# Patient Record
Sex: Male | Born: 1985 | Race: Black or African American | Hispanic: No | Marital: Married | Smoking: Never smoker
Health system: Southern US, Community
[De-identification: ages and names within clinical notes are randomized; demographics above are authoritative.]

## PROBLEM LIST (undated history)

## (undated) HISTORY — PX: CHOLECYSTECTOMY: SHX55

---

## 2012-02-03 ENCOUNTER — Emergency Department: Payer: Self-pay | Admitting: Internal Medicine

## 2012-02-07 ENCOUNTER — Emergency Department: Payer: Self-pay | Admitting: Emergency Medicine

## 2012-08-16 ENCOUNTER — Observation Stay: Payer: Self-pay | Admitting: Surgery

## 2012-08-16 LAB — BASIC METABOLIC PANEL
Calcium, Total: 8.7 mg/dL (ref 8.5–10.1)
Chloride: 107 mmol/L (ref 98–107)
Co2: 27 mmol/L (ref 21–32)
Creatinine: 0.91 mg/dL (ref 0.60–1.30)
EGFR (African American): >60
EGFR (Non-African Amer.): >60
Glucose: 95 mg/dL (ref 65–99)
Osmolality: 274 (ref 275–301)

## 2012-08-16 LAB — CBC
HCT: 41.8 % (ref 40.0–52.0)
HGB: 13.6 g/dL (ref 13.0–18.0)
RBC: 5.29 10*6/uL (ref 4.40–5.90)
RDW: 15.8 % — ABNORMAL HIGH (ref 11.5–14.5)

## 2012-08-16 LAB — HEPATIC FUNCTION PANEL A (ARMC)
Alkaline Phosphatase: 44 U/L — ABNORMAL LOW (ref 50–136)
Bilirubin, Direct: 0.2 mg/dL (ref 0.00–0.20)
Bilirubin,Total: 1 mg/dL (ref 0.2–1.0)
SGPT (ALT): 31 U/L (ref 12–78)

## 2012-08-16 LAB — LIPASE, BLOOD: Lipase: 85 U/L (ref 73–393)

## 2012-08-17 LAB — COMPREHENSIVE METABOLIC PANEL
Albumin: 3.7 g/dL (ref 3.4–5.0)
Anion Gap: 3 — ABNORMAL LOW (ref 7–16)
Bilirubin,Total: 0.9 mg/dL (ref 0.2–1.0)
Calcium, Total: 8.6 mg/dL (ref 8.5–10.1)
Chloride: 103 mmol/L (ref 98–107)
Creatinine: 0.92 mg/dL (ref 0.60–1.30)
Potassium: 3.8 mmol/L (ref 3.5–5.1)
SGOT(AST): 42 U/L — ABNORMAL HIGH (ref 15–37)
SGPT (ALT): 34 U/L (ref 12–78)
Total Protein: 7.4 g/dL (ref 6.4–8.2)

## 2012-08-17 LAB — CBC WITH DIFFERENTIAL/PLATELET
Basophil #: 0 10*3/uL (ref 0.0–0.1)
Basophil %: 0.1 %
Eosinophil #: 0 10*3/uL (ref 0.0–0.7)
Eosinophil %: 0 %
HCT: 39 % — ABNORMAL LOW (ref 40.0–52.0)
Lymphocyte #: 1 10*3/uL (ref 1.0–3.6)
MCH: 25.9 pg — ABNORMAL LOW (ref 26.0–34.0)
MCHC: 32.8 g/dL (ref 32.0–36.0)
MCV: 79 fL — ABNORMAL LOW (ref 80–100)
Monocyte #: 1.4 x10 3/mm — ABNORMAL HIGH (ref 0.2–1.0)
Monocyte %: 15.7 %
Neutrophil #: 6.6 10*3/uL — ABNORMAL HIGH (ref 1.4–6.5)
Neutrophil %: 73.2 %
RBC: 4.93 10*6/uL (ref 4.40–5.90)
RDW: 15.7 % — ABNORMAL HIGH (ref 11.5–14.5)
WBC: 9.1 10*3/uL (ref 3.8–10.6)

## 2012-08-18 LAB — CBC WITH DIFFERENTIAL/PLATELET
Basophil #: 0 10*3/uL (ref 0.0–0.1)
Eosinophil %: 0.5 %
HCT: 34.9 % — ABNORMAL LOW (ref 40.0–52.0)
Lymphocyte #: 1.7 10*3/uL (ref 1.0–3.6)
Lymphocyte %: 21.5 %
MCH: 26.9 pg (ref 26.0–34.0)
Monocyte #: 1.3 x10 3/mm — ABNORMAL HIGH (ref 0.2–1.0)
Monocyte %: 16.6 %
Neutrophil #: 4.9 10*3/uL (ref 1.4–6.5)
Neutrophil %: 61.1 %
Platelet: 204 10*3/uL (ref 150–440)

## 2012-08-18 LAB — COMPREHENSIVE METABOLIC PANEL
Albumin: 3.1 g/dL — ABNORMAL LOW (ref 3.4–5.0)
Anion Gap: 3 — ABNORMAL LOW (ref 7–16)
BUN: 6 mg/dL — ABNORMAL LOW (ref 7–18)
Bilirubin,Total: 0.6 mg/dL (ref 0.2–1.0)
Calcium, Total: 8.5 mg/dL (ref 8.5–10.1)
EGFR (Non-African Amer.): >60
Osmolality: 271 (ref 275–301)
Potassium: 3.8 mmol/L (ref 3.5–5.1)
SGPT (ALT): 30 U/L (ref 12–78)
Total Protein: 6.6 g/dL (ref 6.4–8.2)

## 2013-07-25 IMAGING — CR DG LUMBAR SPINE 2-3V
1 series · 3 of 3 positions shown · non-contrast
Comparison: none

REASON FOR EXAM: pain following trauma
COMMENTS:

[Series 1: t lumbar spine ap · 0.14mm/px · 3 of 3 slices shown]
[im 1/3]
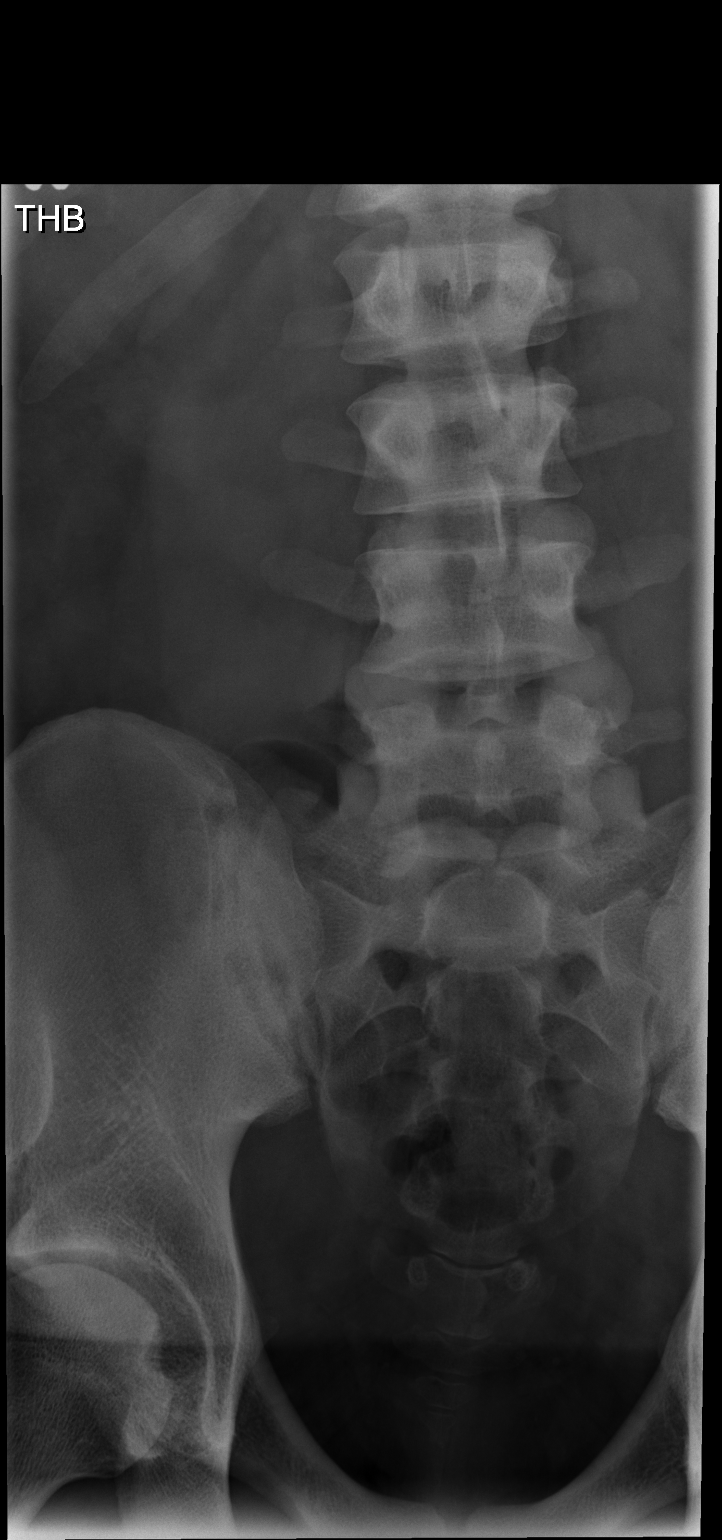
[im 2/3]
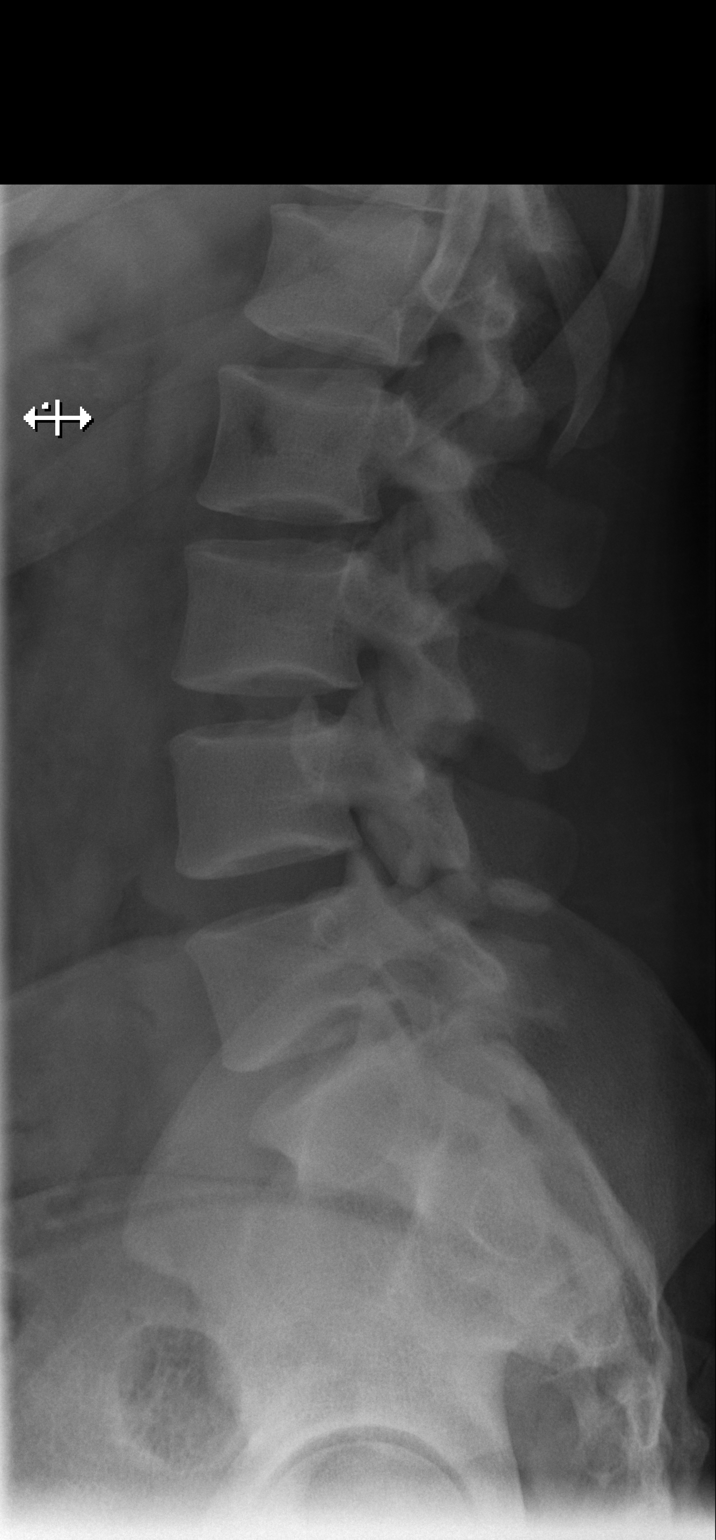
[im 3/3]
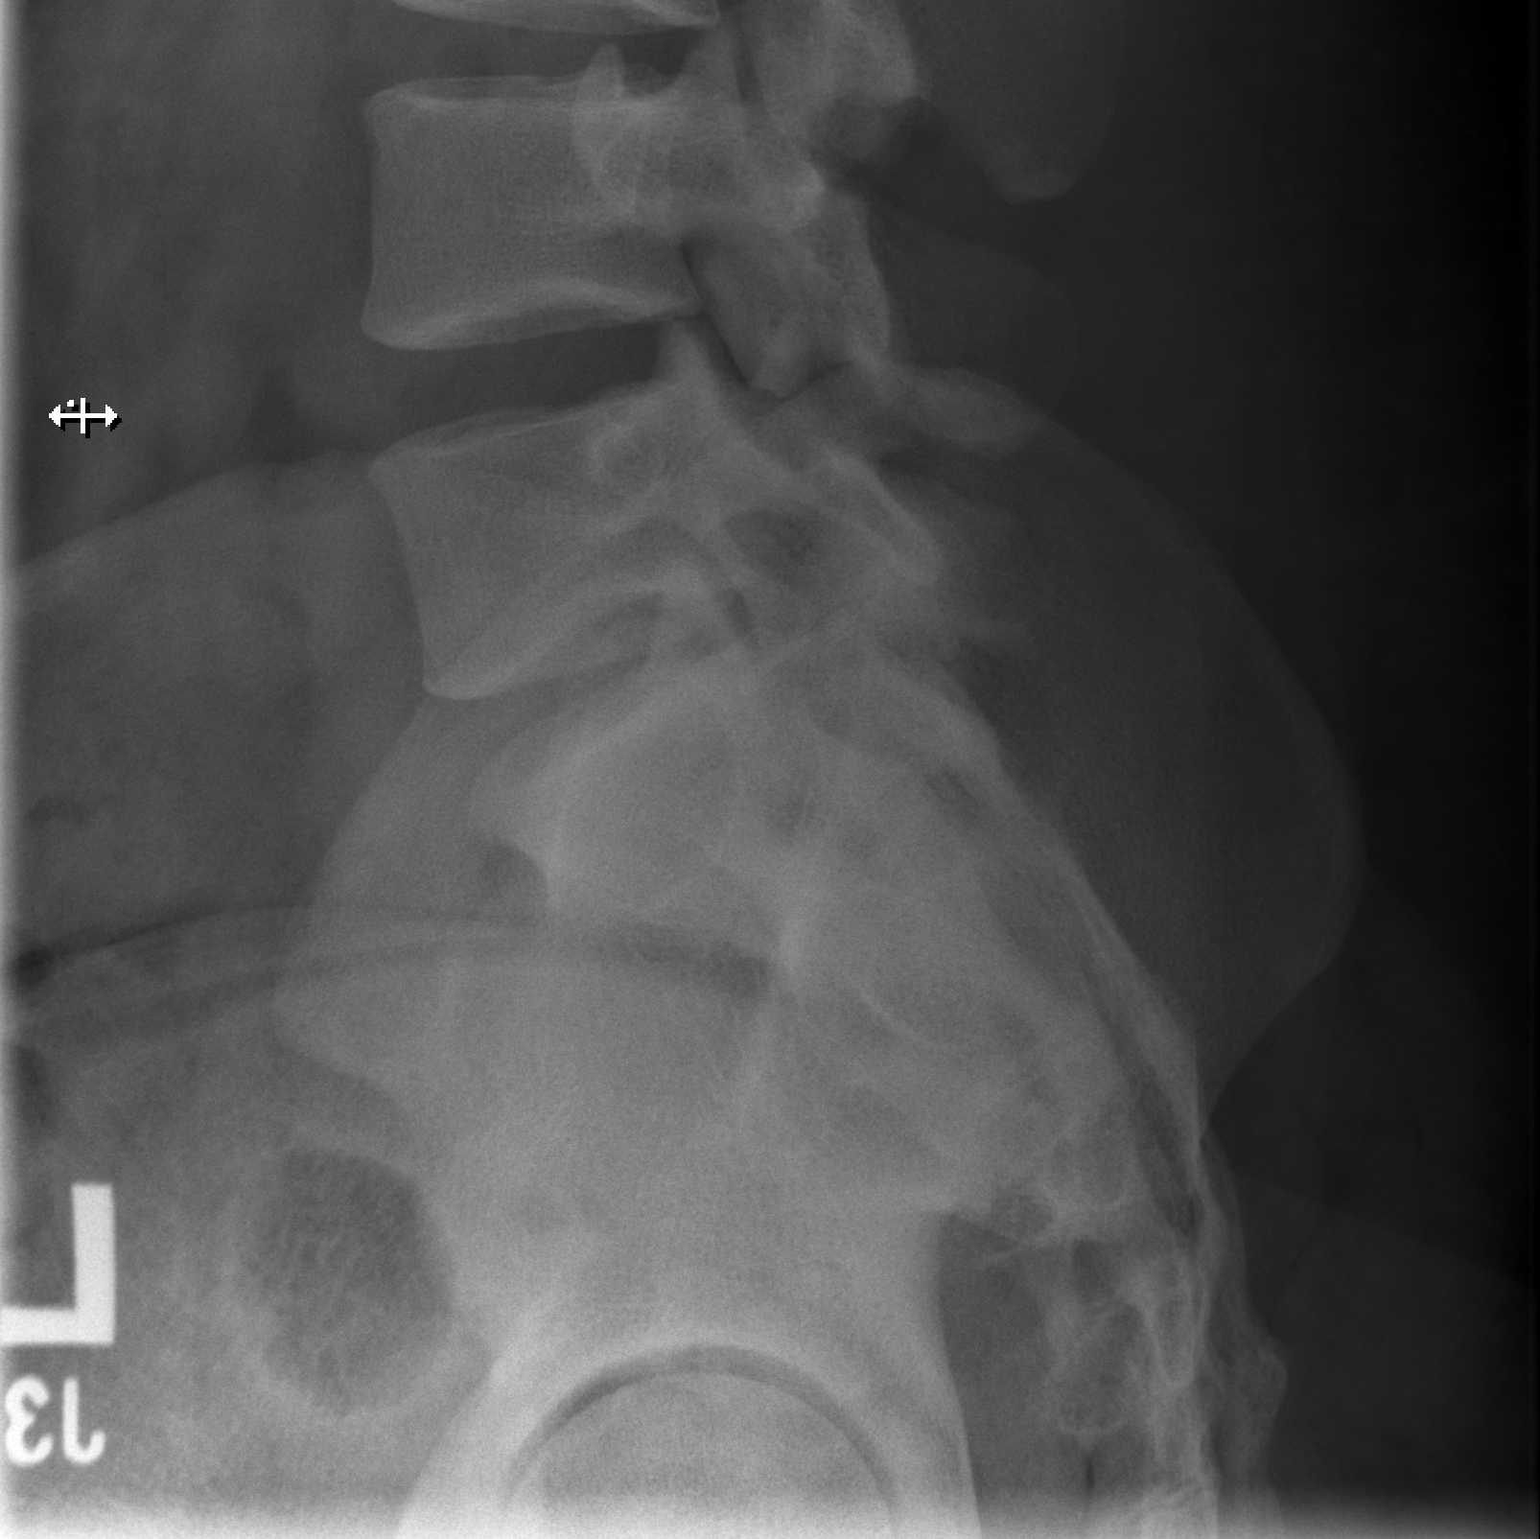

[3 of 3 positions shown; findings below may reference images not displayed]

PROCEDURE:     DXR - DXR LUMBAR SPINE AP AND LATERAL  - February 07, 2012  [DATE]

RESULT:     The lumbar vertebral bodies are preserved in height. The
intervertebral disc space heights are well-maintained. There is no evidence
of a pars defect or spondylolisthesis. The pedicles appear intact as do the
transverse processes. There is a spina bifida occulta at S1.
IMPRESSION: I do not see objective evidence of an acute fracture of the
lumbar spine. Followup cross-sectional imaging is available if patient's
symptoms warrant further evaluation.

[REDACTED]

## 2013-08-25 ENCOUNTER — Emergency Department: Payer: Self-pay | Admitting: Emergency Medicine

## 2013-08-25 LAB — CBC WITH DIFFERENTIAL/PLATELET
BASOS PCT: 0.5 %
Basophil #: 0 10*3/uL (ref 0.0–0.1)
EOS PCT: 0.3 %
Eosinophil #: 0 10*3/uL (ref 0.0–0.7)
HCT: 45.2 % (ref 40.0–52.0)
HGB: 14.5 g/dL (ref 13.0–18.0)
LYMPHS ABS: 1.3 10*3/uL (ref 1.0–3.6)
Lymphocyte %: 19.7 %
MCH: 25 pg — ABNORMAL LOW (ref 26.0–34.0)
MCHC: 32.1 g/dL (ref 32.0–36.0)
MCV: 78 fL — AB (ref 80–100)
MONOS PCT: 12.1 %
Monocyte #: 0.8 x10 3/mm (ref 0.2–1.0)
NEUTROS PCT: 67.4 %
Neutrophil #: 4.4 10*3/uL (ref 1.4–6.5)
Platelet: 186 10*3/uL (ref 150–440)
RBC: 5.8 10*6/uL (ref 4.40–5.90)
RDW: 15 % — ABNORMAL HIGH (ref 11.5–14.5)
WBC: 6.6 10*3/uL (ref 3.8–10.6)

## 2013-08-25 LAB — MONONUCLEOSIS SCREEN: MONO TEST: NEGATIVE

## 2013-08-27 LAB — BETA STREP CULTURE(ARMC)

## 2014-02-01 IMAGING — US ABDOMEN ULTRASOUND LIMITED
1 series · 14 of 25 positions shown · non-contrast
Comparison: none

REASON FOR EXAM: abd pain
COMMENTS:

PROCEDURE:     US  - US ABDOMEN LIMITED SURVEY  - August 16, 2012  [DATE]
RESULT:

[Series 1: abdomen ultrasound limited · 0.36mm/px · 14 of 49 slices shown]
[im 1/49]
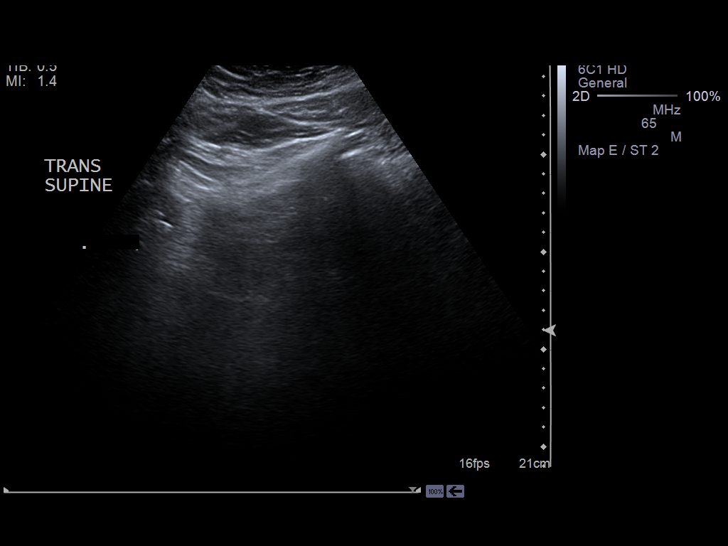
[im 5/49]
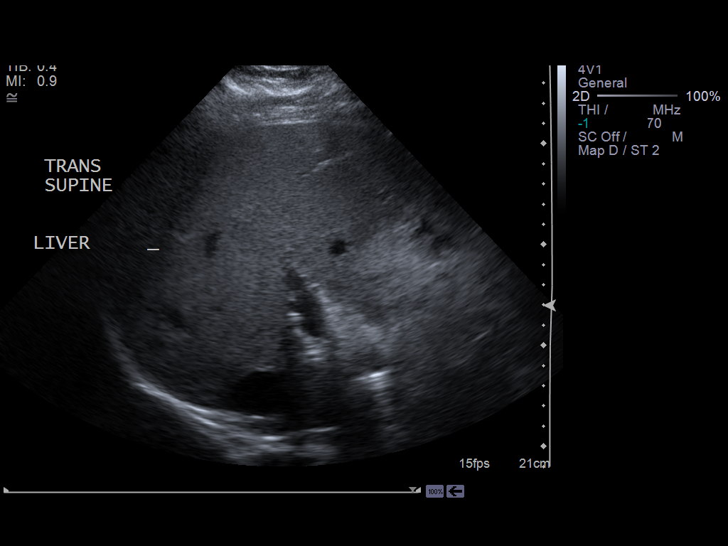
[im 9/49]
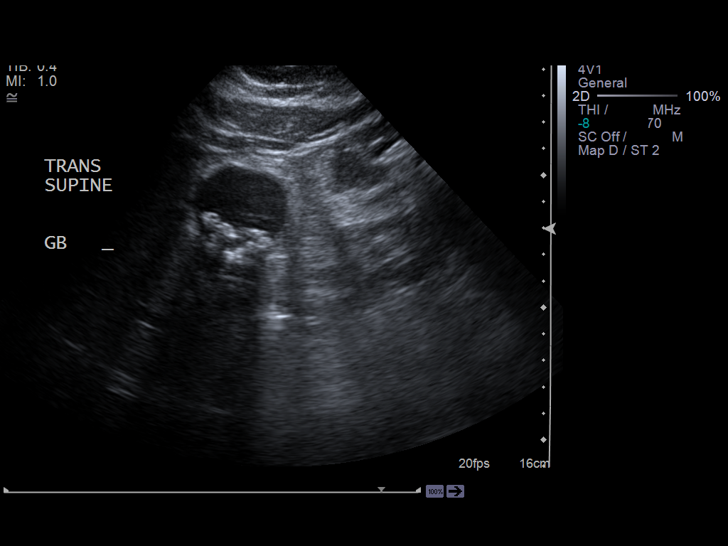
[im 13/49]
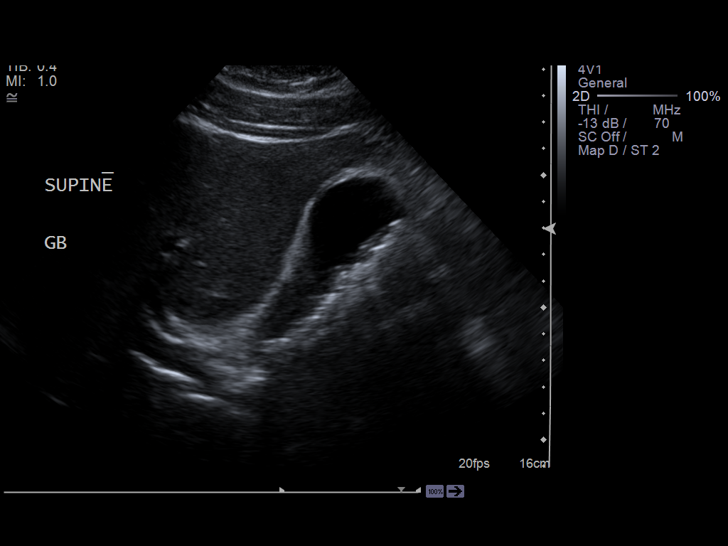
[im 17/49]
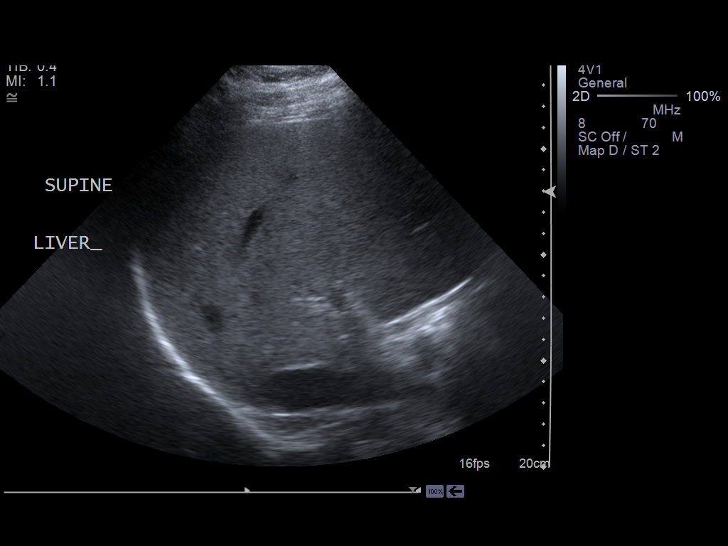
[im 19/49]
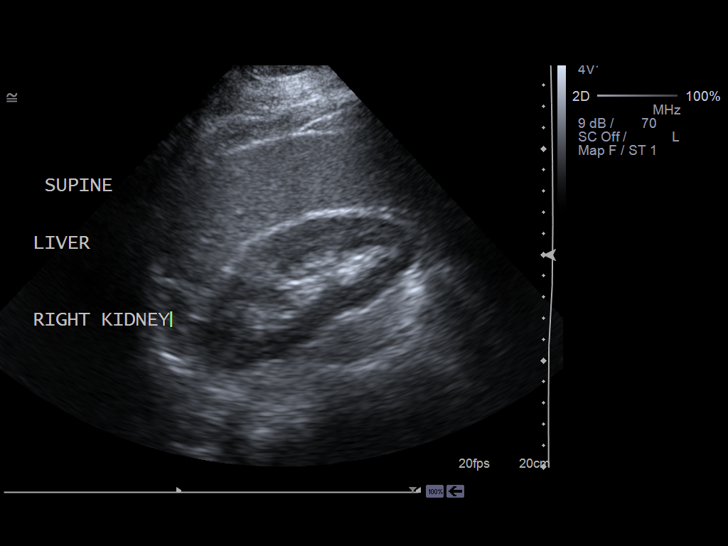
[im 23/49]
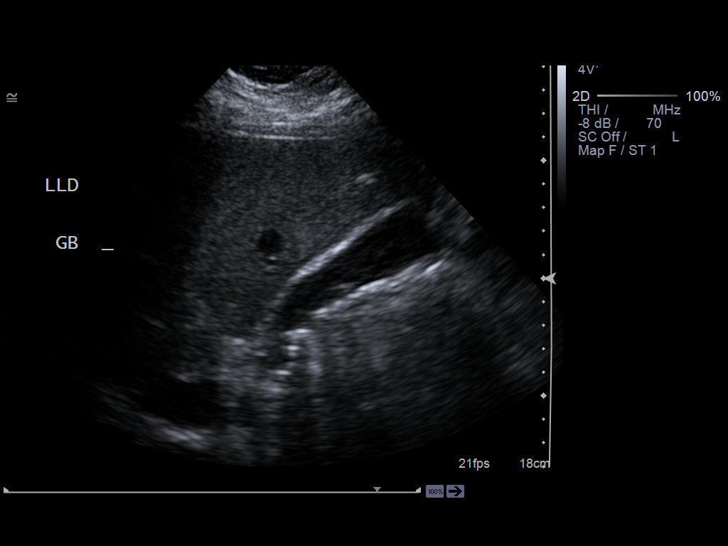
[im 27/49]
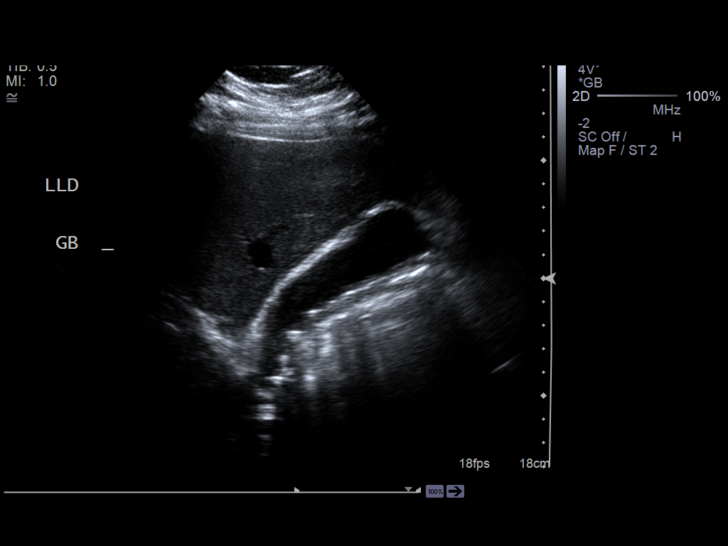
[im 31/49]
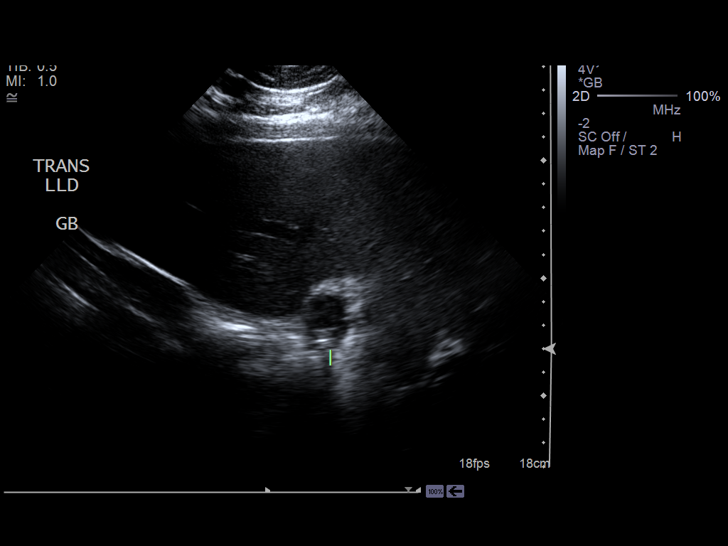
[im 33/49]
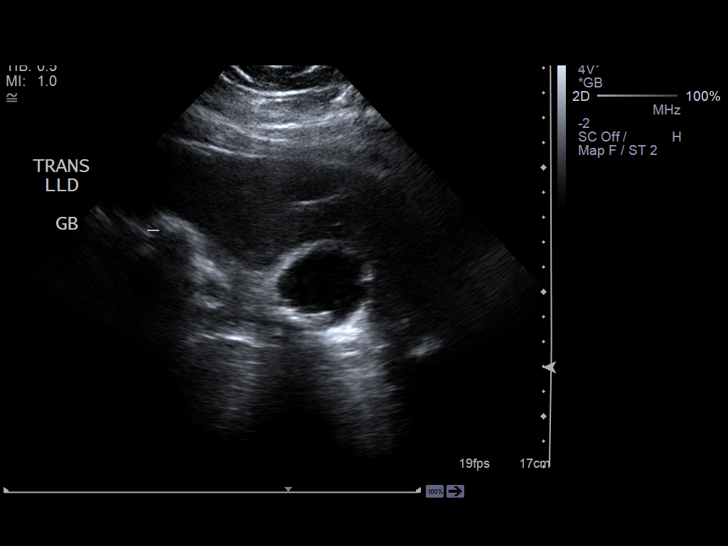
[im 37/49]
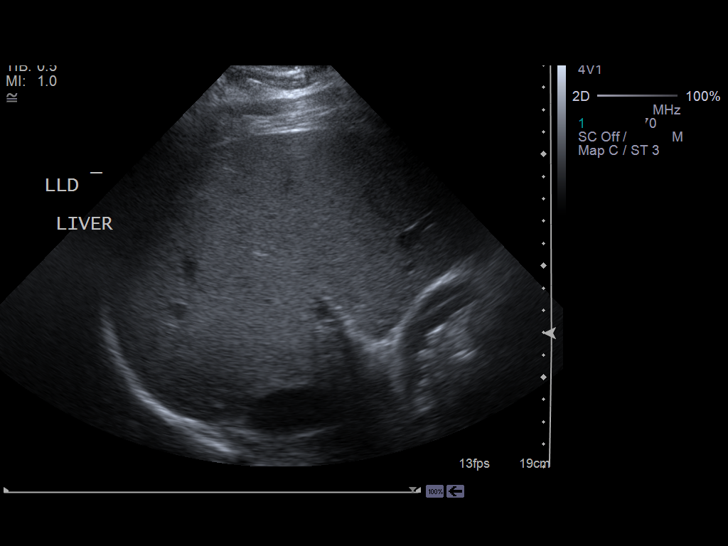
[im 41/49]
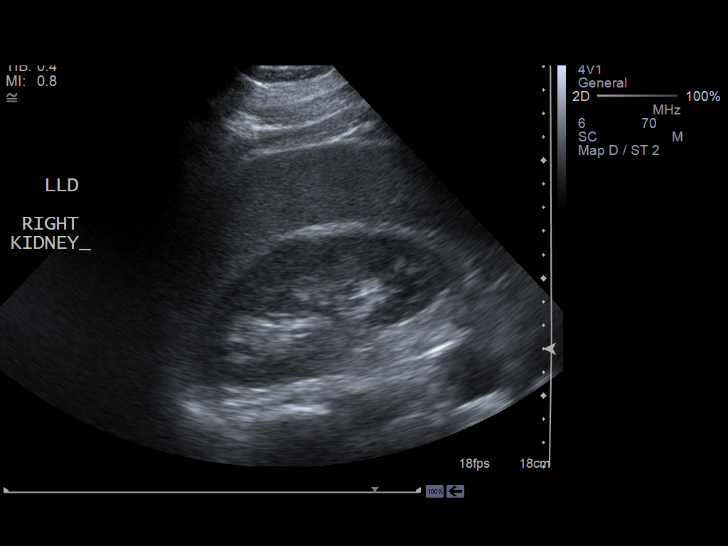
[im 45/49]
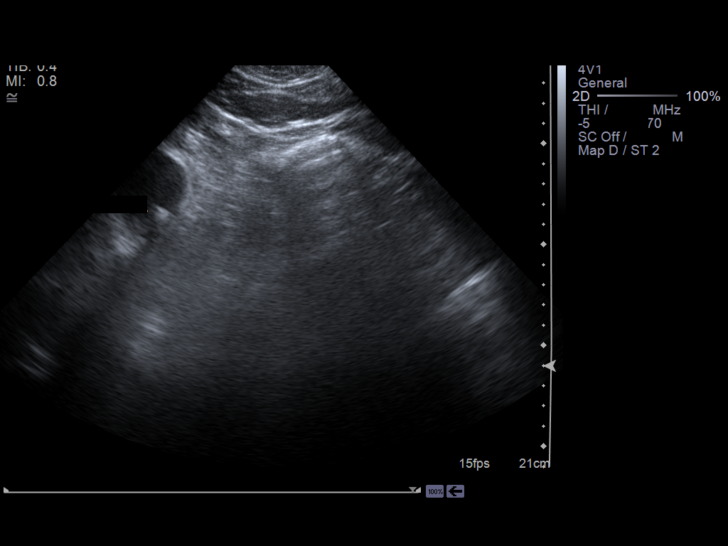
[im 49/49]
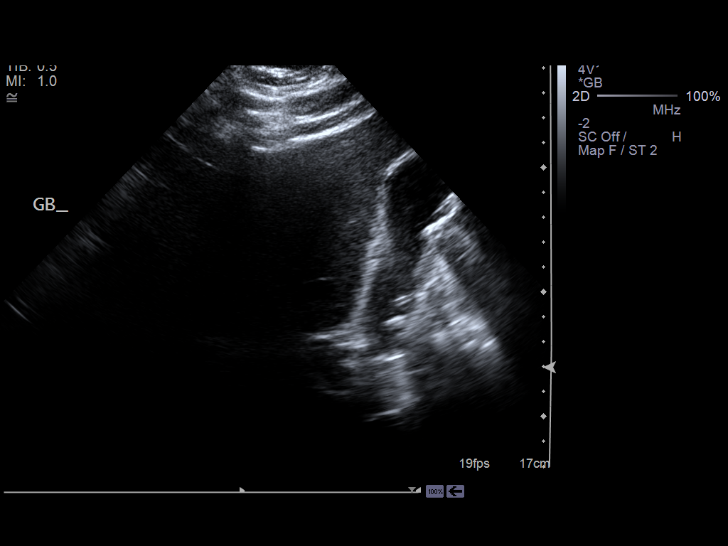

[14 of 25 positions shown; findings below may reference images not displayed]

FINDINGS: The liver demonstrates a homogeneous echotexture. Evaluation of
the gallbladder demonstrates multiple gallstones within the gallbladder with
non-mobile gallstones in the neck of the gallbladder. There is gallbladder
wall thickening at 4 mm. A positive sonographic Murphy's sign is identified.
There is no evidence of intrahepatic or extrahepatic biliary ductal
dilatation. The visualized portions of the pancreas are unremarkable. The
right kidney is unremarkable.
IMPRESSION: 1.  Multiple calculi within the gallbladder with stones appearing lodged in
the neck. There is gallbladder wall thickening and a sonographic Murphy's
sign. These findings are consistent with cholecystitis.
2.  Dr. Vianney of the Emergency Department was informed of these findings via
a preliminary faxed report on 08/16/2012 at [DATE], ET.

## 2014-09-04 NOTE — Discharge Summary (Signed)
PATIENT NAME:  Jose BignessHINTON, Uziah MR#:  [id number] DATE OF BIRTH:  [birth year]  DATE OF ADMISSION:  08/16/2012 DATE OF DISCHARGE:  08/18/2012  DIAGNOSES:  1.  Morbid obesity.  2.  Cholelithiasis.  3.  Acute cholecystitis.  4.  Depression.   PROCEDURE: Laparoscopic cholecystectomy.   HISTORY OF PRESENT ILLNESS AND HOSPITAL COURSE: Patient was admitted to the hospital by Dr. Michela PitcherEly with the diagnosis of acute cholecystitis with cholelithiasis. He was taken to the operating room where laparoscopic cholecystectomy was performed. He had acute cholecystitis and empyema of the gallbladder. He had an uncomplicated postoperative recovery. His drain was removed, on postoperative day 2. No bile was noted. His fevers abated and he was discharged in stable condition on oral Augmentin and Percocet for pain. He is  to follow up in my office in 10 days.    ____________________________ Adah Salvageichard E. Excell Seltzerooper, MD rec:cc D: 08/18/2012 09:29:41 ET T: 08/18/2012 17:43:10 ET JOB#: [id number]  cc: Adah Salvageichard E. Excell Seltzerooper, MD, <Dictator> Lattie HawICHARD E Kaius Daino MD ELECTRONICALLY SIGNED 08/18/2012 18:51

## 2014-09-04 NOTE — Op Note (Signed)
PATIENT NAME:  Jose Lloyd, Jose Lloyd MR#:  [id number] DATE OF BIRTH:  1985/07/19  DATE OF PROCEDURE:  08/16/2012  PREOPERATIVE DIAGNOSIS: Acute cholecystitis.   POSTOPERATIVE DIAGNOSIS: Acute cholecystitis with empyema of the gallbladder.   PROCEDURE PERFORMED:  Laparoscopic cholecystectomy.   SURGEON: Richard E. Excell Seltzerooper, M.D.   ASSISTANT:  Simone CuriaKatie Shuler, PA-S  ANESTHESIA: General with endotracheal tube.   INDICATIONS: This is a patient with right upper quadrant pain and a workup showing gallstones and likely cholecystitis. Preoperatively, we discussed the rationale for surgery, the options of observation, the risk of bleeding, infection recurrence of symptoms, failure to resolve the symptoms, open procedure, bile duct damage, bile duct leak and retained common bile duct stone any of which could require further surgery and/or ERCP, stent and papillotomy. This was all reviewed for him in the preop holding area a second time. He understood and agreed to proceed.   FINDINGS: Acute cholecystitis with empyema, small gallstones, multiple gallstones impacted in the infundibulum of the gallbladder.   DESCRIPTION OF PROCEDURE: The patient was induced to general anesthesia. He was given IV antibiotics. VTE prophylaxis was in place. He was prepped and draped in a sterile fashion. Marcaine was infiltrated in the skin and subcutaneous tissues around the periumbilical area.  Incision was made. Veress needle was placed. Pneumoperitoneum was obtained. A 5 mm trocar port was placed. The abdominal cavity was explored and under direct vision a 10 mm epigastric port and 2 lateral 5 mm ports were placed. The gallbladder was placed on tension. It was found to be very edematous.  The peritoneum over the infundibulum was incised bluntly.  Multiple small cystic lymphatics were engorged; therefore, they were doubly clipped and divided. This allowed for good visualization of the cystic duct as it entered the infundibulum of the  gallbladder. Here it was doubly clipped and divided and then the cystic artery was doubly clipped and divided and the gallbladder was taken from the gallbladder fossa with electrocautery. It was found to be greatly edematous and almost necrotic. It was then passed out through an enlarged epigastric port site with the aid of an Endo Catch bag. The area was checked for hemostasis and found to be adequate. There was no sign of bile leak or bleeding or bowel injury.   Into the foramen of Winslow was placed a 10 mm JP drain brought out through a lateral port site. It was sewn in with 3-0 nylon. Again the area was checked for hemostasis. There was no sign of bile leak or bleeding. The camera was placed in the epigastric site to view back to the periumbilical site. There was no sign of bowel injury. Therefore, pneumoperitoneum was released. All ports were removed. Fascial edges at the epigastric site where approximated with figure-of-eight 0 Vicryls, made difficult in this obese patient, with an enlarged port site. Steri-Strips, Mastisol and sterile dressings were placed after placing 4-0 subcuticular Monocryl at all skin edges. He was taken to the recovery room in stable condition to be admitted for continued care.  ____________________________ Adah Salvageichard E. Excell Seltzerooper, MD rec:sb D: 08/16/2012 13:28:29 ET T: 08/16/2012 13:52:12 ET JOB#: [id number]  cc: Adah Salvageichard E. Excell Seltzerooper, MD, <Dictator> Lattie HawICHARD E COOPER MD ELECTRONICALLY SIGNED 08/16/2012 14:06

## 2014-09-04 NOTE — H&P (Signed)
PATIENT NAME:  Jose Lloyd, Jose Lloyd MR#:  [id number] DATE OF BIRTH:  03-Feb-1986  DATE OF ADMISSION:  08/16/2012  PRIMARY CARE PHYSICIAN: None.   ADMITTING PHYSICIAN: Quentin Orealph L. Ely III, MD   CHIEF COMPLAINT: Abdominal pain, nausea.   BRIEF HISTORY: The patient is a 29 year old gentleman with 3- to 4-day history of midepigastric, subxiphoid and right upper quadrant abdominal pain. He has had previous similar symptoms approximately 3 or 4 times in the past, all of which had lasted 8 to 12 hours, occasionally as long as 24 hours. No episodes have lasted as long as the current episode nor been as symptomatic. He presented to the Emergency Room with marked midepigastric and subxiphoid pain. Workup revealed multiple gallstones, possible impacted cystic duct stone. Normal white blood cell count, normal metabolic panel. Liver function studies are not available as yet.   The patient denies a history of vomiting, although he has been significantly nauseated. He has not eaten anything since yesterday afternoon. He has had mild diarrhea. He has had no fever or chills. He denies a history of hepatitis, yellow jaundice, pancreatitis, peptic ulcer disease, previous diagnosis of gallbladder disease or diverticulitis. He has had no previous abdominal surgery. He denies any other medical problems.   MEDICATIONS: His only current medication is cyclobenzaprine 10 mg t.i.d.   ALLERGIES: He has no medical allergies.   SOCIAL HISTORY: He works as a Architectural technologistteacher's assistant.   REVIEW OF SYSTEMS: A 10-point review of systems is unremarkable.   FAMILY HISTORY: Noncontributory.  PHYSICAL EXAMINATION:  GENERAL: He is an alert, pleasant gentleman in mild distress from pain.  VITALS: Blood pressure 158/84, heart rate 72 and regular. He is afebrile.  HEENT: Unremarkable. He has no scleral icterus. No pupillary abnormalities. No facial deformities.  NECK: Supple, nontender, with no adenopathy and a midline trachea.  CHEST: Clear with  no adventitious sounds. He has normal pulmonary excursion.  CARDIAC: No murmurs or gallops to my ear, and he seems to be in normal sinus rhythm.  ABDOMEN: Soft, with some mild midepigastric tenderness. He does have some mild guarding, without rebound. No hernias are noted. He has no surgical scars. He has active bowel sounds.  EXTREMITIES: There are no deformities. Full range of motion and good distal pulses.  PSYCHIATRIC: Normal orientation, normal affect.   IMPRESSION AND PLAN: This gentleman likely has biliary colic, possibly acute cholecystitis. I have not been able to review the ultrasound as yet. In discussing his symptoms, they appear to be persistent, improved with pain medication but not resolved. I have recommended that he consider surgical intervention. I have outlined surgical risks and benefits to him in detail. He is going to discuss it with his family. Will plan admission to the hospital, pain control, IV rehydration at the present time.    ____________________________ Quentin Orealph L. Ely III, MD rle:OSi D: 08/16/2012 06:30:55 ET T: 08/16/2012 06:53:58 ET JOB#: [id number]  cc: Quentin Orealph L. Ely III, MD, <Dictator> Quentin OreALPH L ELY MD ELECTRONICALLY SIGNED 08/21/2012 11:38

## 2016-07-24 ENCOUNTER — Ambulatory Visit (INDEPENDENT_AMBULATORY_CARE_PROVIDER_SITE_OTHER): Payer: BC Managed Care – PPO | Admitting: Family Medicine

## 2016-07-24 ENCOUNTER — Encounter: Payer: Self-pay | Admitting: Family Medicine

## 2016-07-24 VITALS — BP 112/96 | HR 115 | Temp 97.8°F | Resp 16 | Ht 73.0 in | Wt >= 6400 oz

## 2016-07-24 DIAGNOSIS — J0110 Acute frontal sinusitis, unspecified: Secondary | ICD-10-CM | POA: Diagnosis not present

## 2016-07-24 DIAGNOSIS — J301 Allergic rhinitis due to pollen: Secondary | ICD-10-CM | POA: Diagnosis not present

## 2016-07-24 DIAGNOSIS — R030 Elevated blood-pressure reading, without diagnosis of hypertension: Secondary | ICD-10-CM | POA: Diagnosis not present

## 2016-07-24 MED ORDER — FLUTICASONE PROPIONATE 50 MCG/ACT NA SUSP: 2.0000 | Freq: Every day | NASAL | 6 refills | Status: AC

## 2016-07-24 MED ORDER — AMOXICILLIN-POT CLAVULANATE 875-125 MG PO TABS: 1.0000 | ORAL_TABLET | Freq: Two times a day (BID) | ORAL | 0 refills | Status: AC

## 2016-07-24 NOTE — Patient Instructions (Addendum)
   IF you received an x-ray today, you will receive an invoice from [facility]. Please contact [facility] at [phone] with questions or concerns regarding your invoice.   IF you received labwork today, you will receive an invoice from [facility]. Please contact [facility] at [phone] with questions or concerns regarding your invoice.   Our billing staff will not be able to assist you with questions regarding bills from these companies.  You will be contacted with the lab results as soon as they are available. The fastest way to get your results is to activate your My Chart account. Instructions are located on the last page of this paperwork. If you have not heard from us regarding the results in 2 weeks, please contact this office.      Sinusitis, Adult Sinusitis is soreness and inflammation of your sinuses. Sinuses are hollow spaces in the bones around your face. Your sinuses are located:  Around your eyes.  In the middle of your forehead.  Behind your nose.  In your cheekbones. Your sinuses and nasal passages are lined with a stringy fluid (mucus). Mucus normally drains out of your sinuses. When your nasal tissues become inflamed or swollen, the mucus can become trapped or blocked so air cannot flow through your sinuses. This allows bacteria, viruses, and funguses to grow, which leads to infection. Sinusitis can develop quickly and last for 7?10 days (acute) or for more than 12 weeks (chronic). Sinusitis often develops after a cold. What are the causes? This condition is caused by anything that creates swelling in the sinuses or stops mucus from draining, including:  Allergies.  Asthma.  Bacterial or viral infection.  Abnormally shaped bones between the nasal passages.  Nasal growths that contain mucus (nasal polyps).  Narrow sinus openings.  Pollutants, such as chemicals or irritants in the air.  A foreign object stuck in the nose.  A fungal  infection. This is rare. What increases the risk? The following factors may make you more likely to develop this condition:  Having allergies or asthma.  Having had a recent cold or respiratory tract infection.  Having structural deformities or blockages in your nose or sinuses.  Having a weak immune system.  Doing a lot of swimming or diving.  Overusing nasal sprays.  Smoking. What are the signs or symptoms? The main symptoms of this condition are pain and a feeling of pressure around the affected sinuses. Other symptoms include:  Upper toothache.  Earache.  Headache.  Bad breath.  Decreased sense of smell and taste.  A cough that may get worse at night.  Fatigue.  Fever.  Thick drainage from your nose. The drainage is often green and it may contain pus (purulent).  Stuffy nose or congestion.  Postnasal drip. This is when extra mucus collects in the throat or back of the nose.  Swelling and warmth over the affected sinuses.  Sore throat.  Sensitivity to light. How is this diagnosed? This condition is diagnosed based on symptoms, a medical history, and a physical exam. To find out if your condition is acute or chronic, your health care provider may:  Look in your nose for signs of nasal polyps.  Tap over the affected sinus to check for signs of infection.  View the inside of your sinuses using an imaging device that has a light attached (endoscope). If your health care provider suspects that you have chronic sinusitis, you may also:  Be tested for allergies.  Have a sample of   mucus taken from your nose (nasal culture) and checked for bacteria.  Have a mucus sample examined to see if your sinusitis is related to an allergy. If your sinusitis does not respond to treatment and it lasts longer than 8 weeks, you may have an MRI or CT scan to check your sinuses. These scans also help to determine how severe your infection is. In rare cases, a bone biopsy may  be done to rule out more serious types of fungal sinus disease. How is this treated? Treatment for sinusitis depends on the cause and whether your condition is chronic or acute. If a virus is causing your sinusitis, your symptoms will go away on their own within 10 days. You may be given medicines to relieve your symptoms, including:  Topical nasal decongestants. They shrink swollen nasal passages and let mucus drain from your sinuses.  Antihistamines. These drugs block inflammation that is triggered by allergies. This can help to ease swelling in your nose and sinuses.  Topical nasal corticosteroids. These are nasal sprays that ease inflammation and swelling in your nose and sinuses.  Nasal saline washes. These rinses can help to get rid of thick mucus in your nose. If your condition is caused by bacteria, you will be given an antibiotic medicine. If your condition is caused by a fungus, you will be given an antifungal medicine. Surgery may be needed to correct underlying conditions, such as narrow nasal passages. Surgery may also be needed to remove polyps. Follow these instructions at home: Medicines   Take, use, or apply over-the-counter and prescription medicines only as told by your health care provider. These may include nasal sprays.  If you were prescribed an antibiotic medicine, take it as told by your health care provider. Do not stop taking the antibiotic even if you start to feel better. Hydrate and Humidify   Drink enough water to keep your urine clear or pale yellow. Staying hydrated will help to thin your mucus.  Use a cool mist humidifier to keep the humidity level in your home above 50%.  Inhale steam for 10-15 minutes, 3-4 times a day or as told by your health care provider. You can do this in the bathroom while a hot shower is running.  Limit your exposure to cool or dry air. Rest   Rest as much as possible.  Sleep with your head raised (elevated).  Make sure to  get enough sleep each night. General instructions   Apply a warm, moist washcloth to your face 3-4 times a day or as told by your health care provider. This will help with discomfort.  Wash your hands often with soap and water to reduce your exposure to viruses and other germs. If soap and water are not available, use hand sanitizer.  Do not smoke. Avoid being around people who are smoking (secondhand smoke).  Keep all follow-up visits as told by your health care provider. This is important. Contact a health care provider if:  You have a fever.  Your symptoms get worse.  Your symptoms do not improve within 10 days. Get help right away if:  You have a severe headache.  You have persistent vomiting.  You have pain or swelling around your face or eyes.  You have vision problems.  You develop confusion.  Your neck is stiff.  You have trouble breathing. This information is not intended to replace advice given to you by your health care provider. Make sure you discuss any questions you have with   your health care provider. Document Released: 05/01/2005 Document Revised: 12/26/2015 Document Reviewed: 02/24/2015 Elsevier Interactive Patient Education  2017 [employer]  

## 2016-07-24 NOTE — Progress Notes (Signed)
  Chief Complaint  Patient presents with  . Cough    WITH GREEN YELLOW MUCUS x 3 DAYS  . SINUS PRESSURE    with headache    HPI   Pt reports sinus headaches (pain behind the eyes and localized to the forehead and occasionally the nose) and congestion He reports fevers and chills He reports that this has been about 3 days He reports denies asthma He is a nonsmoker He reports a history of sinus problems with seasonal changes related related to allergies  He states that he is coughing up greenish mucus for 3 days No shortness of breath or wheezing  He is able to sleep intermittently but due to congestion his sleep is disturbed    No past medical history on file.  Current Outpatient Prescriptions  Medication Sig Dispense Refill  . amoxicillin-clavulanate (AUGMENTIN) 875-125 MG tablet Take 1 tablet by mouth 2 (two) times daily. 20 tablet 0  . fluticasone (FLONASE) 50 MCG/ACT nasal spray Place 2 sprays into both nostrils daily. 16 g 6   No current facility-administered medications for this visit.     Allergies: No Known Allergies  No past surgical history on file.  Social History   Social History  . Marital status: Single    Spouse name: N/A  . Number of children: N/A  . Years of education: N/A   Social History Main Topics  . Smoking status: Never Smoker  . Smokeless tobacco: Never Used  . Alcohol use No  . Drug use: No  . Sexual activity: Not Asked   Other Topics Concern  . None   Social History Narrative  . None    ROS See hpi  Objective: Vitals:   07/24/16 1046 07/24/16 1049  BP: (!) 110/102 (!) 112/96  Pulse: (!) 115   Resp: 16   Temp: 97.8 F (36.6 C)   TempSrc: Oral   SpO2: 95%   Weight: (!) 402 lb (182.3 kg)   Height: 6\' 1"  (1.854 m)     Physical Exam  General: alert, oriented, in NAD Head: normocephalic, atraumatic, no sinus tenderness Eyes: EOM intact, no scleral icterus or conjunctival injection Ears: TM clear bilaterally Throat:  no pharyngeal exudate or erythema Lymph: no posterior auricular, submental or cervical lymph adenopathy Heart: normal rate, normal sinus rhythm, no murmurs Lungs: clear to auscultation bilaterally, no wheezing    Assessment and Plan Ivin BootyJoshua was seen today for cough and sinus pressure.  Diagnoses and all orders for this visit:  Acute non-recurrent frontal sinusitis- advised pt to take augmentin, flonase to help with congestion and tylenol prn -     fluticasone (FLONASE) 50 MCG/ACT nasal spray; Place 2 sprays into both nostrils daily. -     amoxicillin-clavulanate (AUGMENTIN) 875-125 MG tablet; Take 1 tablet by mouth 2 (two) times daily.  Elevated BP without diagnosis of hypertension- discussed that he should follow up for bp check and if elevated will need to start medication with lifestyle modification  Chronic seasonal allergic rhinitis due to pollen- discussed using flonase to treat seasonal allergy symptoms And discussed that he should continue his medications through the spring changes -     fluticasone (FLONASE) 50 MCG/ACT nasal spray; Place 2 sprays into both nostrils daily.     Zoe A Stallings

## 2016-07-26 NOTE — Addendum Note (Signed)
Addended by: Jaxson Keener A on: 07/26/2016 10:14 AM   Modules accepted: Level of Service  

## 2017-09-24 ENCOUNTER — Other Ambulatory Visit: Payer: Self-pay

## 2017-09-24 DIAGNOSIS — Z79899 Other long term (current) drug therapy: Secondary | ICD-10-CM | POA: Insufficient documentation

## 2017-09-24 DIAGNOSIS — Z9049 Acquired absence of other specified parts of digestive tract: Secondary | ICD-10-CM | POA: Insufficient documentation

## 2017-09-24 DIAGNOSIS — E1165 Type 2 diabetes mellitus with hyperglycemia: Secondary | ICD-10-CM | POA: Diagnosis not present

## 2017-09-24 DIAGNOSIS — R350 Frequency of micturition: Secondary | ICD-10-CM | POA: Diagnosis present

## 2017-09-24 LAB — CBC
HCT: 41.8 % (ref 40.0–52.0)
HEMOGLOBIN: 13.9 g/dL (ref 13.0–18.0)
MCH: 24.9 pg — ABNORMAL LOW (ref 26.0–34.0)
MCHC: 33.4 g/dL (ref 32.0–36.0)
MCV: 74.6 fL — ABNORMAL LOW (ref 80.0–100.0)
Platelets: 282 10*3/uL (ref 150–440)
RBC: 5.6 MIL/uL (ref 4.40–5.90)
RDW: 15.2 % — AB (ref 11.5–14.5)
WBC: 12.2 10*3/uL — ABNORMAL HIGH (ref 3.8–10.6)

## 2017-09-24 LAB — URINALYSIS, COMPLETE (UACMP) WITH MICROSCOPIC
BACTERIA UA: NONE SEEN
BILIRUBIN URINE: NEGATIVE
Glucose, UA: >=500 mg/dL — AB
Hgb urine dipstick: NEGATIVE
KETONES UR: 80 mg/dL — AB
LEUKOCYTES UA: NEGATIVE
Nitrite: NEGATIVE
Protein, ur: NEGATIVE mg/dL
Specific Gravity, Urine: 1.026 (ref 1.005–1.030)
pH: 5 (ref 5.0–8.0)

## 2017-09-24 LAB — GLUCOSE, CAPILLARY: Glucose-Capillary: 409 mg/dL — ABNORMAL HIGH (ref 65–99)

## 2017-09-24 LAB — COMPREHENSIVE METABOLIC PANEL
ALBUMIN: 5.2 g/dL — AB (ref 3.5–5.0)
ALK PHOS: 52 U/L (ref 38–126)
ALT: 36 U/L (ref 17–63)
ANION GAP: 17 — AB (ref 5–15)
AST: 49 U/L — ABNORMAL HIGH (ref 15–41)
BUN: 15 mg/dL (ref 6–20)
CHLORIDE: 90 mmol/L — AB (ref 101–111)
CO2: 22 mmol/L (ref 22–32)
Calcium: 9.6 mg/dL (ref 8.9–10.3)
Creatinine, Ser: 1.01 mg/dL (ref 0.61–1.24)
GFR calc Af Amer: >60 mL/min (ref >60)
GFR calc non Af Amer: >60 mL/min (ref >60)
GLUCOSE: 418 mg/dL — AB (ref 65–99)
POTASSIUM: 3.7 mmol/L (ref 3.5–5.1)
SODIUM: 129 mmol/L — AB (ref 135–145)
Total Bilirubin: 1.6 mg/dL — ABNORMAL HIGH (ref 0.3–1.2)
Total Protein: 8.7 g/dL — ABNORMAL HIGH (ref 6.5–8.1)

## 2017-09-24 NOTE — ED Triage Notes (Signed)
[facility] went to [facility] for [facility] and fatigue was told to come here for elevated blood sugar. Pt has no hx of diabetes.

## 2017-09-25 ENCOUNTER — Emergency Department
Admission: EM | Admit: 2017-09-25 | Discharge: 2017-09-25 | Disposition: A | Discharge status: Home / Self Care | Payer: BC Managed Care – PPO | Attending: Emergency Medicine | Admitting: Emergency Medicine

## 2017-09-25 ENCOUNTER — Encounter: Payer: Self-pay | Admitting: Emergency Medicine

## 2017-09-25 DIAGNOSIS — R739 Hyperglycemia, unspecified: Secondary | ICD-10-CM

## 2017-09-25 DIAGNOSIS — E119 Type 2 diabetes mellitus without complications: Secondary | ICD-10-CM

## 2017-09-25 LAB — GLUCOSE, CAPILLARY
GLUCOSE-CAPILLARY: 349 mg/dL — AB (ref 65–99)
GLUCOSE-CAPILLARY: 305 mg/dL — AB (ref 65–99)
GLUCOSE-CAPILLARY: 300 mg/dL — AB (ref 65–99)

## 2017-09-25 LAB — BETA-HYDROXYBUTYRIC ACID: BETA-HYDROXYBUTYRIC ACID: 4.08 mmol/L — AB (ref 0.05–0.27)

## 2017-09-25 LAB — BLOOD GAS, VENOUS
Acid-base deficit: 2.4 mmol/L — ABNORMAL HIGH (ref 0.0–2.0)
Bicarbonate: 23.2 mmol/L (ref 20.0–28.0)
O2 Saturation: 83.7 %
PCO2 VEN: 42 mmHg — AB (ref 44.0–60.0)
PH VEN: 7.35 (ref 7.250–7.430)
Patient temperature: 37
pO2, Ven: 51 mmHg — ABNORMAL HIGH (ref 32.0–45.0)

## 2017-09-25 LAB — BASIC METABOLIC PANEL
Anion gap: 12 (ref 5–15)
BUN: 13 mg/dL (ref 6–20)
CALCIUM: 8.7 mg/dL — AB (ref 8.9–10.3)
CO2: 23 mmol/L (ref 22–32)
CREATININE: 0.86 mg/dL (ref 0.61–1.24)
Chloride: 97 mmol/L — ABNORMAL LOW (ref 101–111)
GFR calc non Af Amer: >60 mL/min (ref >60)
Glucose, Bld: 309 mg/dL — ABNORMAL HIGH (ref 65–99)
Potassium: 3.5 mmol/L (ref 3.5–5.1)
SODIUM: 132 mmol/L — AB (ref 135–145)

## 2017-09-25 MED ORDER — SODIUM CHLORIDE 0.9 % IV BOLUS
1000.0000 mL | Freq: Once | INTRAVENOUS | Status: AC
  Administered 2017-09-25: 1000 mL via INTRAVENOUS

## 2017-09-25 MED ORDER — INSULIN ASPART 100 UNIT/ML ~~LOC~~ SOLN
5.0000 [IU] | Freq: Once | SUBCUTANEOUS | Status: AC
  Administered 2017-09-25: 5 [IU] via INTRAVENOUS
  Filled 2017-09-25: qty 1

## 2017-09-25 MED ORDER — METFORMIN HCL 500 MG PO TABS: 500.0000 mg | ORAL_TABLET | Freq: Two times a day (BID) | ORAL | 0 refills | Status: AC

## 2017-09-25 NOTE — ED Provider Notes (Signed)
[facility] Emergency Department Provider Note   ____________________________________________   First MD Initiated Contact with Patient 09/25/17 [phone]     (approximate)  I have reviewed the triage vital signs and the nursing notes.   HISTORY  Chief Complaint Hyperglycemia    HPI Jose Lloyd is a 32 y.o. male who comes into the hospital today with some dry mouth, frequent urination, weight loss and blurred vision.  The patient was actually sent after going to urgent care with a finding of some elevated blood sugars.  The patient states that his blood sugar was 409.  He is been having a dry mouth for 3 days and the urinary frequency for the past week.  The patient also was feeling weak and lightheaded.  He states that he has a primary care physician but he has not seen them in over a year and a half.  He denies any chest pain, shortness of breath, abdominal pain, nausea, vomiting.  The patient is here today for evaluation.  History reviewed. No pertinent past medical history.  There are no active problems to display for this patient.   Past Surgical History:  Procedure Laterality Date  . CHOLECYSTECTOMY      Prior to Admission medications   Medication Sig Start Date End Date Taking? Authorizing Provider  amoxicillin-clavulanate (AUGMENTIN) 875-125 MG tablet Take 1 tablet by mouth 2 (two) times daily. 07/24/16   Doristine Bosworth, MD  fluticasone (FLONASE) 50 MCG/ACT nasal spray Place 2 sprays into both nostrils daily. 07/24/16   Doristine Bosworth, MD  metFORMIN (GLUCOPHAGE) 500 MG tablet Take 1 tablet (500 mg total) by mouth 2 (two) times daily with a meal. 09/25/17 09/25/18  Rebecka Apley, MD    Allergies Patient has no known allergies.  No family history on file.  Social History Social History   Tobacco Use  . Smoking status: Never Smoker  . Smokeless tobacco: Never Used  Substance Use Topics  . Alcohol use: No  . Drug use: No    Review  of Systems  Constitutional: No fever/chills Eyes: blurred vision ENT: No sore throat. Cardiovascular: Denies chest pain. Respiratory: Denies shortness of breath. Gastrointestinal: No abdominal pain.  No nausea, no vomiting.  No diarrhea.  No constipation. Genitourinary: urinary frequency Musculoskeletal: Negative for back pain. Skin: Negative for rash. Neurological: light headedness, weakness   ____________________________________________   PHYSICAL EXAM:  VITAL SIGNS: ED Triage Vitals  Enc Vitals Group     BP 09/24/17 2219 (!) 171/96     Pulse Rate 09/24/17 2219 (!) 112     Resp 09/24/17 2219 20     Temp 09/24/17 2219 98.4 F (36.9 C)     Temp Source 09/24/17 2219 Oral     SpO2 09/24/17 2219 98 %     Weight 09/24/17 2220 (!) 397 lb (180.1 kg)     Height 09/24/17 2220  (1.905 m)     Head Circumference --      Peak Flow --      Pain Score --      Pain Loc --      Pain Edu? --      Excl. in GC? --     Constitutional: Alert and oriented. Well appearing and in mild distress. Eyes: Conjunctivae are normal. PERRL. EOMI. Head: Atraumatic. Nose: No congestion/rhinnorhea. Mouth/Throat: Mucous membranes are moist.  Oropharynx non-erythematous. Cardiovascular: Normal rate, regular rhythm. Grossly normal heart sounds.  Good peripheral circulation. Respiratory: Normal respiratory effort.  No retractions. Lungs CTAB. Gastrointestinal: Soft and nontender. No distention. Positive bowel sounds Musculoskeletal: No lower extremity tenderness nor edema.   Neurologic:  Normal speech and language.  Skin:  Skin is warm, dry and intact.  Psychiatric: Mood and affect are normal.   ____________________________________________   LABS (all labs ordered are listed, but only abnormal results are displayed)  Labs Reviewed  GLUCOSE, CAPILLARY - Abnormal; Notable for the following components:      Result Value   Glucose-Capillary 409 (*)    All other components within normal limits    CBC - Abnormal; Notable for the following components:   WBC 12.2 (*)    MCV 74.6 (*)    MCH 24.9 (*)    RDW 15.2 (*)    All other components within normal limits  COMPREHENSIVE METABOLIC PANEL - Abnormal; Notable for the following components:   Sodium 129 (*)    Chloride 90 (*)    Glucose, Bld 418 (*)    Total Protein 8.7 (*)    Albumin 5.2 (*)    AST 49 (*)    Total Bilirubin 1.6 (*)    Anion gap 17 (*)    All other components within normal limits  URINALYSIS, COMPLETE (UACMP) WITH MICROSCOPIC - Abnormal; Notable for the following components:   Color, Urine YELLOW (*)    APPearance CLEAR (*)    Glucose, UA >=500 (*)    Ketones, ur 80 (*)    All other components within normal limits  BLOOD GAS, VENOUS - Abnormal; Notable for the following components:   pCO2, Ven 42 (*)    pO2, Ven 51.0 (*)    Acid-base deficit 2.4 (*)    All other components within normal limits  BETA-HYDROXYBUTYRIC ACID - Abnormal; Notable for the following components:   Beta-Hydroxybutyric Acid 4.08 (*)    All other components within normal limits  GLUCOSE, CAPILLARY - Abnormal; Notable for the following components:   Glucose-Capillary 349 (*)    All other components within normal limits  BASIC METABOLIC PANEL - Abnormal; Notable for the following components:   Sodium 132 (*)    Chloride 97 (*)    Glucose, Bld 309 (*)    Calcium 8.7 (*)    All other components within normal limits  GLUCOSE, CAPILLARY - Abnormal; Notable for the following components:   Glucose-Capillary 305 (*)    All other components within normal limits  CBG MONITORING, ED  CBG MONITORING, ED  CBG MONITORING, ED   ____________________________________________  EKG  none ____________________________________________  RADIOLOGY  ED MD interpretation:  none  Official radiology report(s): No results found.  ____________________________________________   PROCEDURES  Procedure(s) performed: None  Procedures  Critical  Care performed: No  ____________________________________________   INITIAL IMPRESSION / ASSESSMENT AND PLAN / ED COURSE  As part of my medical decision making, I reviewed the following data within the electronic MEDICAL RECORD NUMBER Notes from prior ED visits and  Controlled Substance Database   This is a 32 year old male who comes into the hospital today with some dry mouth, polyuria, polydipsia, blurred vision and lightheadedness.  The patient was seen at an urgent care and found to have elevated blood sugar so he was sent to the hospital.  The patient had some blood work drawn to include a CBC, CMP, urinalysis and a beta hydroxybutyrate.  I did order two liters of normal saline for the patient.  I will recheck the patient's blood sugars and then reassess the patient.  After the  2 L of normal saline we did check the patient's blood sugar.  It was 305.  I did repeat the BMP and the patient's sodium was 132 with an anion gap of 12.  I did give the patient a dose of insulin 5 units IV.  He will be discharged home and encouraged to follow-up with his primary care physician.      ____________________________________________   FINAL CLINICAL IMPRESSION(S) / ED DIAGNOSES  Final diagnoses:  Hyperglycemia  New onset type 2 diabetes mellitus (HCC)     ED Discharge Orders        Ordered    metFORMIN (GLUCOPHAGE) 500 MG tablet  2 times daily with meals     09/25/17 6213       Note:  This document was prepared using Dragon voice recognition software and may include unintentional dictation errors.    Rebecka Apley, MD 09/25/17 [phone]

## 2017-09-25 NOTE — Discharge Instructions (Signed)
Please follow up with your primary care physician for further evaluation of your blood sugar and your new diabetes

## 2019-04-05 ENCOUNTER — Other Ambulatory Visit: Payer: Self-pay

## 2019-04-05 DIAGNOSIS — Z20822 Contact with and (suspected) exposure to covid-19: Secondary | ICD-10-CM

## 2019-04-07 LAB — NOVEL CORONAVIRUS, NAA: SARS-CoV-2, NAA: NOT DETECTED

## 2023-04-06 ENCOUNTER — Other Ambulatory Visit: Payer: Self-pay | Admitting: Family Medicine

## 2023-04-06 ENCOUNTER — Ambulatory Visit
Admission: RE | Admit: 2023-04-06 | Discharge: 2023-04-06 | Disposition: A | Discharge status: Home / Self Care | Payer: BC Managed Care – PPO | Source: Ambulatory Visit | Attending: Family Medicine | Admitting: Family Medicine

## 2023-04-06 DIAGNOSIS — R062 Wheezing: Secondary | ICD-10-CM
